# Patient Record
Sex: Female | Born: 1935
Health system: Southern US, Community
[De-identification: ages and names within clinical notes are randomized; demographics above are authoritative.]

---

## 2005-04-11 ENCOUNTER — Emergency Department: Payer: Self-pay | Admitting: Emergency Medicine

## 2005-04-11 ENCOUNTER — Other Ambulatory Visit: Payer: Self-pay

## 2005-04-19 ENCOUNTER — Emergency Department: Payer: Self-pay | Admitting: Internal Medicine

## 2005-04-19 ENCOUNTER — Other Ambulatory Visit: Payer: Self-pay

## 2005-04-26 ENCOUNTER — Ambulatory Visit: Payer: Self-pay | Admitting: Internal Medicine

## 2005-05-17 ENCOUNTER — Ambulatory Visit: Payer: Self-pay | Admitting: Unknown Physician Specialty

## 2005-07-11 ENCOUNTER — Ambulatory Visit: Payer: Self-pay | Admitting: Unknown Physician Specialty

## 2005-11-08 ENCOUNTER — Ambulatory Visit: Payer: Self-pay | Admitting: Unknown Physician Specialty

## 2006-08-06 ENCOUNTER — Ambulatory Visit: Payer: Self-pay | Admitting: Ophthalmology

## 2006-08-12 ENCOUNTER — Ambulatory Visit: Payer: Self-pay | Admitting: Ophthalmology

## 2007-06-23 ENCOUNTER — Ambulatory Visit: Payer: Self-pay | Admitting: Radiation Oncology

## 2007-07-03 ENCOUNTER — Ambulatory Visit: Payer: Self-pay | Admitting: Radiation Oncology

## 2007-07-23 ENCOUNTER — Ambulatory Visit: Payer: Self-pay | Admitting: Radiation Oncology

## 2007-08-23 ENCOUNTER — Ambulatory Visit: Payer: Self-pay | Admitting: Radiation Oncology

## 2007-09-22 ENCOUNTER — Ambulatory Visit: Payer: Self-pay | Admitting: Radiation Oncology

## 2007-10-23 ENCOUNTER — Ambulatory Visit: Payer: Self-pay | Admitting: Radiation Oncology

## 2008-01-21 ENCOUNTER — Ambulatory Visit: Payer: Self-pay | Admitting: Radiation Oncology

## 2008-02-04 ENCOUNTER — Ambulatory Visit: Payer: Self-pay | Admitting: Internal Medicine

## 2008-02-05 ENCOUNTER — Ambulatory Visit: Payer: Self-pay | Admitting: Physician Assistant

## 2008-02-12 ENCOUNTER — Ambulatory Visit: Payer: Self-pay | Admitting: Physician Assistant

## 2008-02-12 ENCOUNTER — Ambulatory Visit: Payer: Self-pay | Admitting: Radiation Oncology

## 2008-02-20 ENCOUNTER — Ambulatory Visit: Payer: Self-pay | Admitting: Radiation Oncology

## 2008-03-10 ENCOUNTER — Other Ambulatory Visit: Payer: Self-pay

## 2008-03-10 ENCOUNTER — Observation Stay: Payer: Self-pay | Admitting: Unknown Physician Specialty

## 2008-04-19 ENCOUNTER — Ambulatory Visit: Payer: Self-pay | Admitting: Urology

## 2008-04-21 ENCOUNTER — Ambulatory Visit: Payer: Self-pay | Admitting: Radiation Oncology

## 2009-01-20 ENCOUNTER — Emergency Department: Payer: Self-pay | Admitting: Emergency Medicine

## 2010-02-27 ENCOUNTER — Ambulatory Visit: Payer: Self-pay | Admitting: Internal Medicine

## 2011-10-19 ENCOUNTER — Ambulatory Visit: Payer: Self-pay | Admitting: Physician Assistant

## 2012-03-15 ENCOUNTER — Inpatient Hospital Stay: Payer: Self-pay | Admitting: Internal Medicine

## 2012-03-15 LAB — BASIC METABOLIC PANEL
Anion Gap: 6 — ABNORMAL LOW (ref 7–16)
BUN: 12 mg/dL (ref 7–18)
Calcium, Total: 8.4 mg/dL — ABNORMAL LOW (ref 8.5–10.1)
Chloride: 102 mmol/L (ref 98–107)
Co2: 30 mmol/L (ref 21–32)
Creatinine: 0.58 mg/dL — ABNORMAL LOW (ref 0.60–1.30)
Osmolality: 274 (ref 275–301)
Sodium: 138 mmol/L (ref 136–145)

## 2012-03-15 LAB — URINALYSIS, COMPLETE
Bacteria: NONE SEEN
Glucose,UR: NEGATIVE mg/dL (ref 0–75)
Nitrite: NEGATIVE
RBC,UR: 2 /HPF (ref 0–5)
Specific Gravity: 1.015 (ref 1.003–1.030)
Squamous Epithelial: 1
WBC UR: 1 /HPF (ref 0–5)

## 2012-03-15 LAB — CBC WITH DIFFERENTIAL/PLATELET
Basophil %: 0.3 %
Eosinophil #: 0 10*3/uL (ref 0.0–0.7)
Eosinophil %: 0.6 %
Lymphocyte #: 1.2 10*3/uL (ref 1.0–3.6)
Lymphocyte %: 16.5 %
MCH: 30.7 pg (ref 26.0–34.0)
MCV: 90 fL (ref 80–100)
Monocyte #: 0.5 x10 3/mm (ref 0.2–0.9)
Neutrophil #: 5.3 10*3/uL (ref 1.4–6.5)
Neutrophil %: 76.1 %
RDW: 13.6 % (ref 11.5–14.5)
WBC: 7 10*3/uL (ref 3.6–11.0)

## 2012-03-16 ENCOUNTER — Ambulatory Visit: Payer: Self-pay | Admitting: Orthopaedic Surgery

## 2012-03-16 LAB — BASIC METABOLIC PANEL
Anion Gap: 9 (ref 7–16)
BUN: 9 mg/dL (ref 7–18)
Chloride: 106 mmol/L (ref 98–107)
Creatinine: 0.43 mg/dL — ABNORMAL LOW (ref 0.60–1.30)
EGFR (African American): >60
Glucose: 73 mg/dL (ref 65–99)
Osmolality: 277 (ref 275–301)
Sodium: 140 mmol/L (ref 136–145)

## 2012-03-16 LAB — CBC WITH DIFFERENTIAL/PLATELET
Basophil %: 0.7 %
Eosinophil #: 0 10*3/uL (ref 0.0–0.7)
Lymphocyte #: 0.7 10*3/uL — ABNORMAL LOW (ref 1.0–3.6)
Lymphocyte %: 18.1 %
MCH: 30.8 pg (ref 26.0–34.0)
MCHC: 34.6 g/dL (ref 32.0–36.0)
Monocyte #: 0.4 x10 3/mm (ref 0.2–0.9)
Monocyte %: 9.6 %
Neutrophil #: 2.8 10*3/uL (ref 1.4–6.5)
Neutrophil %: 70.9 %
Platelet: 137 10*3/uL — ABNORMAL LOW (ref 150–440)
RBC: 3.56 10*6/uL — ABNORMAL LOW (ref 3.80–5.20)
WBC: 3.9 10*3/uL (ref 3.6–11.0)

## 2012-04-16 ENCOUNTER — Observation Stay: Payer: Self-pay | Admitting: Internal Medicine

## 2012-04-16 LAB — COMPREHENSIVE METABOLIC PANEL
Albumin: 2.8 g/dL — ABNORMAL LOW (ref 3.4–5.0)
Alkaline Phosphatase: 83 U/L (ref 50–136)
BUN: 10 mg/dL (ref 7–18)
Bilirubin,Total: 1.1 mg/dL — ABNORMAL HIGH (ref 0.2–1.0)
Calcium, Total: 8.4 mg/dL — ABNORMAL LOW (ref 8.5–10.1)
Chloride: 101 mmol/L (ref 98–107)
Co2: 28 mmol/L (ref 21–32)
Creatinine: 0.7 mg/dL (ref 0.60–1.30)
EGFR (African American): >60
EGFR (Non-African Amer.): >60
Glucose: 73 mg/dL (ref 65–99)
Potassium: 4 mmol/L (ref 3.5–5.1)
SGOT(AST): 16 U/L (ref 15–37)
Sodium: 136 mmol/L (ref 136–145)
Total Protein: 6.3 g/dL — ABNORMAL LOW (ref 6.4–8.2)

## 2012-04-16 LAB — CBC
HCT: 34.9 % — ABNORMAL LOW (ref 35.0–47.0)
MCHC: 33.9 g/dL (ref 32.0–36.0)
MCV: 91 fL (ref 80–100)
Platelet: 157 10*3/uL (ref 150–440)
WBC: 4.9 10*3/uL (ref 3.6–11.0)

## 2012-04-16 LAB — URINALYSIS, COMPLETE
Glucose,UR: NEGATIVE mg/dL (ref 0–75)
Nitrite: POSITIVE
Ph: 6 (ref 4.5–8.0)
Protein: 30
RBC,UR: 43 /HPF (ref 0–5)
WBC UR: 2168 /HPF (ref 0–5)

## 2012-04-16 LAB — CK TOTAL AND CKMB (NOT AT ARMC)
CK, Total: 47 U/L (ref 21–215)
CK-MB: 1 ng/mL (ref 0.5–3.6)

## 2012-04-16 LAB — PROTIME-INR: Prothrombin Time: 13.8 secs (ref 11.5–14.7)

## 2012-04-17 LAB — CBC WITH DIFFERENTIAL/PLATELET
Basophil %: 0.7 %
HCT: 33.6 % — ABNORMAL LOW (ref 35.0–47.0)
Lymphocyte #: 0.4 10*3/uL — ABNORMAL LOW (ref 1.0–3.6)
Lymphocyte %: 14.7 %
MCHC: 34.2 g/dL (ref 32.0–36.0)
MCV: 90 fL (ref 80–100)
Monocyte %: 7.6 %
Neutrophil %: 75.6 %
Platelet: 157 10*3/uL (ref 150–440)
RBC: 3.71 10*6/uL — ABNORMAL LOW (ref 3.80–5.20)
RDW: 14.2 % (ref 11.5–14.5)
WBC: 4.9 10*3/uL (ref 3.6–11.0)

## 2012-04-17 LAB — BASIC METABOLIC PANEL
Anion Gap: 8 (ref 7–16)
BUN: 13 mg/dL (ref 7–18)
Chloride: 104 mmol/L (ref 98–107)
Co2: 27 mmol/L (ref 21–32)
EGFR (African American): >60
EGFR (Non-African Amer.): >60
Glucose: 84 mg/dL (ref 65–99)
Osmolality: 277 (ref 275–301)

## 2012-04-18 LAB — URINE CULTURE

## 2012-04-21 ENCOUNTER — Emergency Department: Payer: Self-pay | Admitting: Emergency Medicine

## 2012-04-21 LAB — COMPREHENSIVE METABOLIC PANEL
Albumin: 2.6 g/dL — ABNORMAL LOW (ref 3.4–5.0)
Alkaline Phosphatase: 80 U/L (ref 50–136)
BUN: 8 mg/dL (ref 7–18)
Bilirubin,Total: 0.7 mg/dL (ref 0.2–1.0)
Chloride: 103 mmol/L (ref 98–107)
Co2: 30 mmol/L (ref 21–32)
Creatinine: 0.59 mg/dL — ABNORMAL LOW (ref 0.60–1.30)
EGFR (African American): >60
Glucose: 90 mg/dL (ref 65–99)
SGOT(AST): 17 U/L (ref 15–37)
SGPT (ALT): 10 U/L — ABNORMAL LOW
Sodium: 138 mmol/L (ref 136–145)
Total Protein: 5.8 g/dL — ABNORMAL LOW (ref 6.4–8.2)

## 2012-04-21 LAB — CBC WITH DIFFERENTIAL/PLATELET
Basophil %: 0.8 %
HGB: 11.5 g/dL — ABNORMAL LOW (ref 12.0–16.0)
Lymphocyte %: 25.6 %
MCH: 30.4 pg (ref 26.0–34.0)
MCHC: 33.3 g/dL (ref 32.0–36.0)
MCV: 91 fL (ref 80–100)
Monocyte #: 0.3 x10 3/mm (ref 0.2–0.9)
Neutrophil %: 61.7 %
Platelet: 202 10*3/uL (ref 150–440)
WBC: 3.5 10*3/uL — ABNORMAL LOW (ref 3.6–11.0)

## 2012-04-21 LAB — URINALYSIS, COMPLETE
Bilirubin,UR: NEGATIVE
Blood: NEGATIVE
Ketone: NEGATIVE
Ph: 7 (ref 4.5–8.0)
Protein: NEGATIVE
RBC,UR: 1 /HPF (ref 0–5)
Specific Gravity: 1.009 (ref 1.003–1.030)
Squamous Epithelial: <1

## 2012-05-22 DEATH — deceased

## 2012-07-24 IMAGING — CT CT OF THE RIGHT HIP WITHOUT CONTRAST
1 series · 16 of 32 positions shown, 20 images · non-contrast
Comparison: none

REASON FOR EXAM: right hip pain after a fall
COMMENTS:

PROCEDURE:     CT  - CT HIP RIGHT WITHOUT CONTRAST  - March 15, 2012  [DATE]
RESULT:
TECHNIQUE: Multiplanar imaging of the right hip was obtained utilizing
helical 2 and 3 millimeter acquisition and bone reconstruction algorithm.

[Series 2: hip 3.0 b70s · axial · 0.37mm/px · z∈[-862,-656]mm · 16 of 77 slices shown, 20 images]
[im 5/77  soft-tissue]
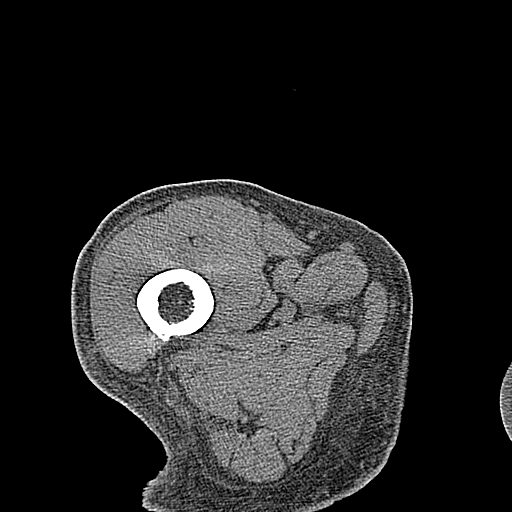
[im 5/77  bone]
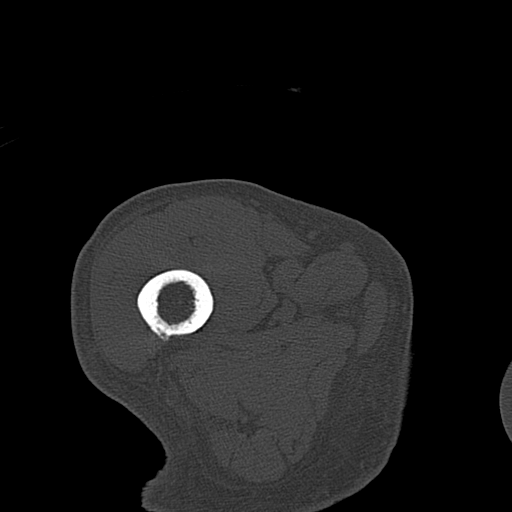
[im 10/77  soft-tissue]
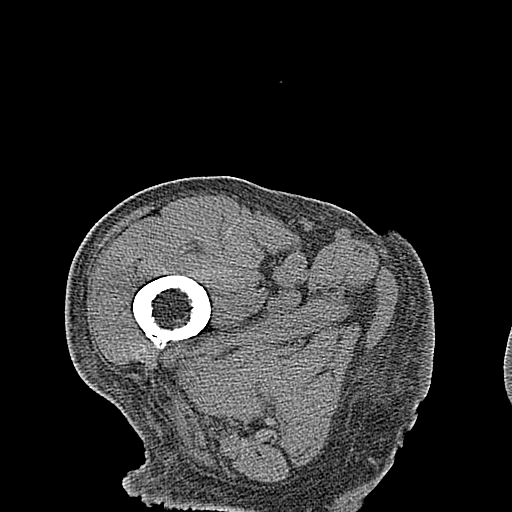
[im 15/77  soft-tissue]
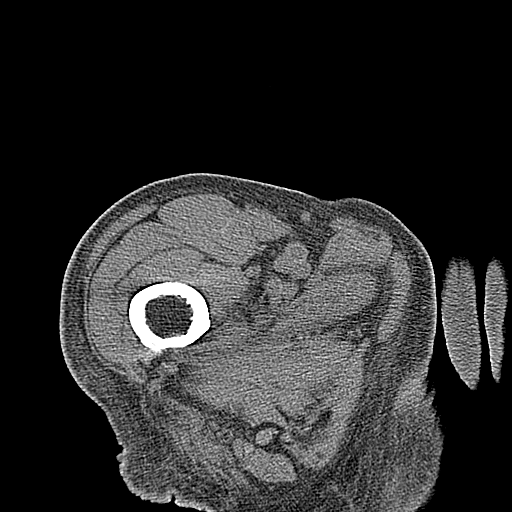
[im 20/77  soft-tissue]
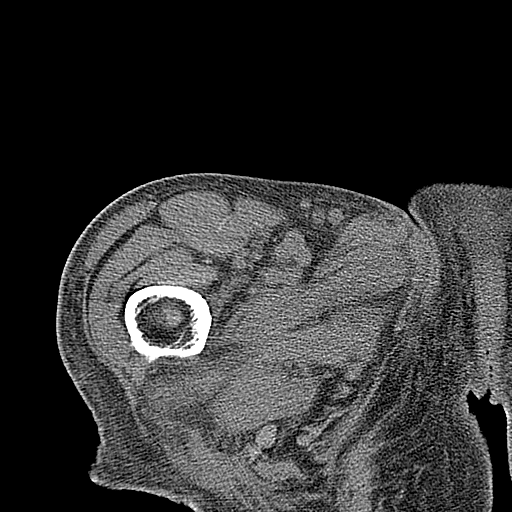
[im 25/77  soft-tissue]
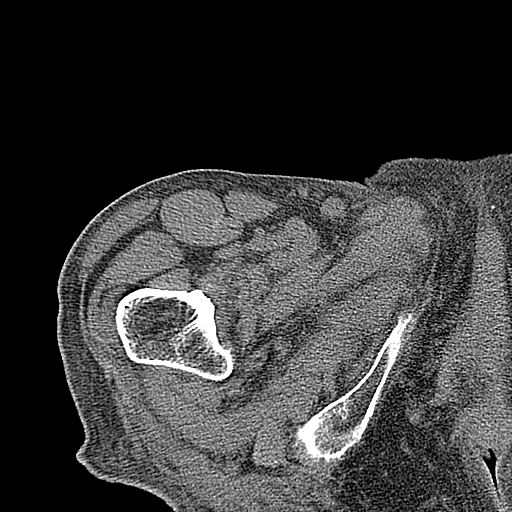
[im 30/77  soft-tissue]
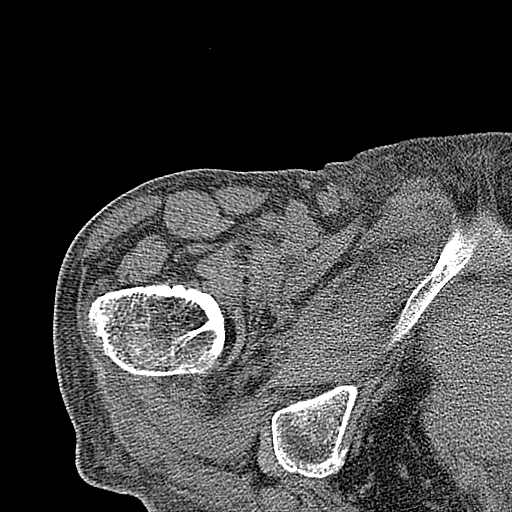
[im 35/77  soft-tissue]
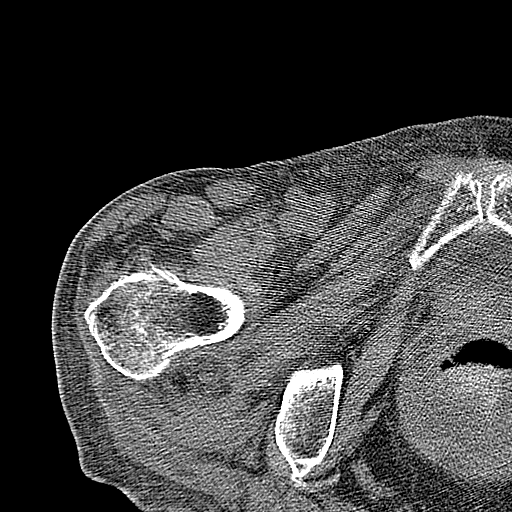
[im 42/77  soft-tissue]
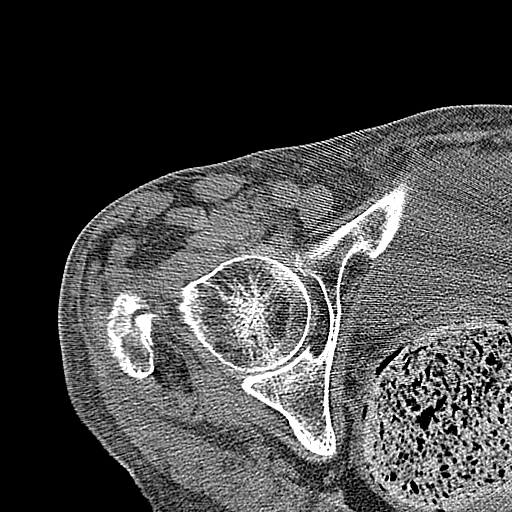
[im 47/77  soft-tissue]
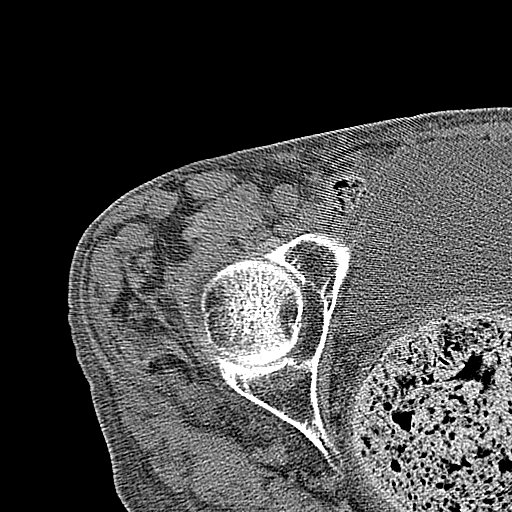
[im 47/77  bone]
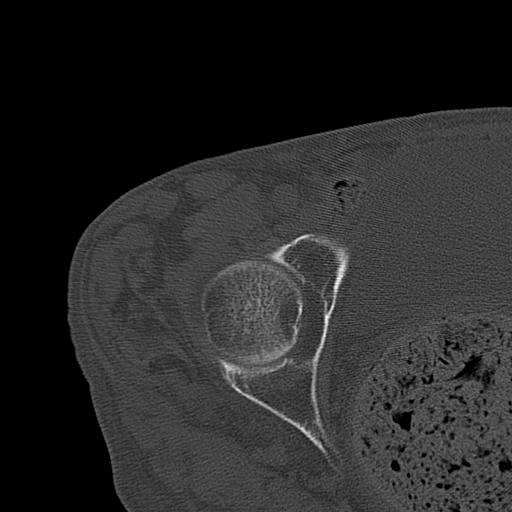
[im 52/77  soft-tissue]
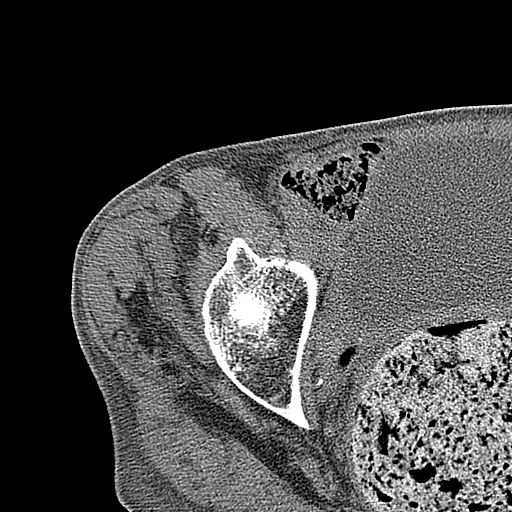
[im 57/77  soft-tissue]
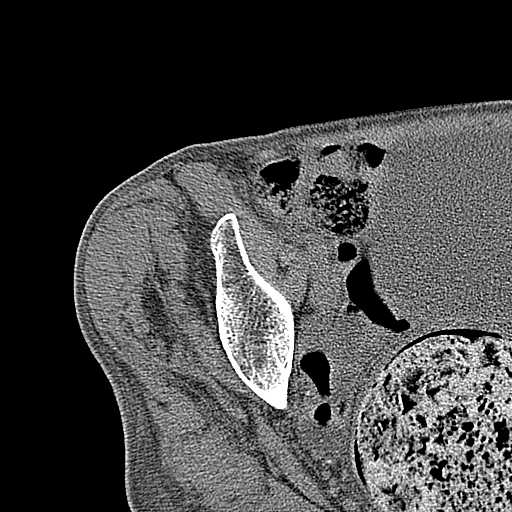
[im 62/77  soft-tissue]
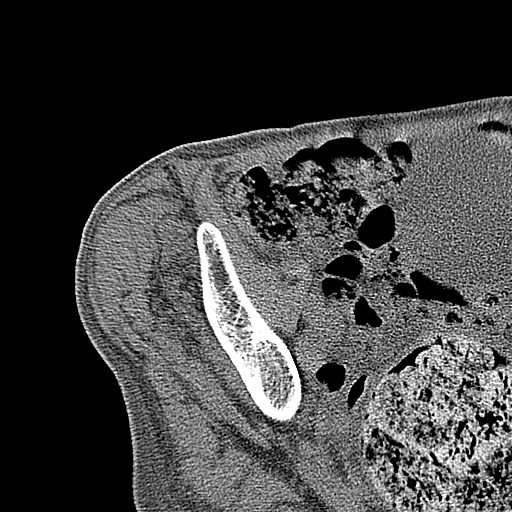
[im 67/77  soft-tissue]
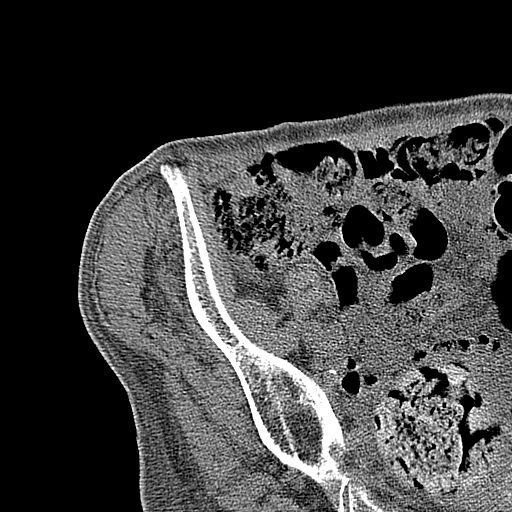
[im 67/77  lung]
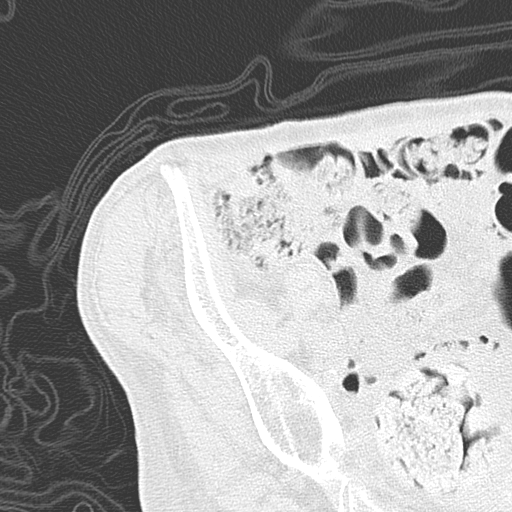
[im 69/77  lung]
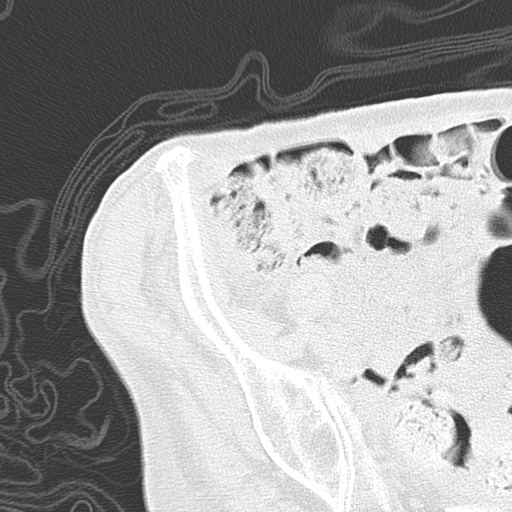
[im 72/77  soft-tissue]
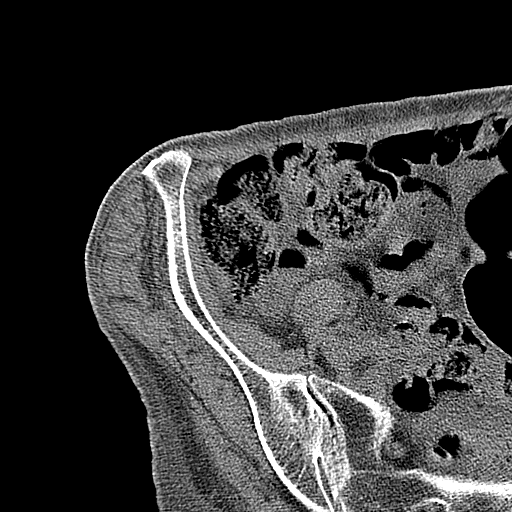
[im 72/77  lung]
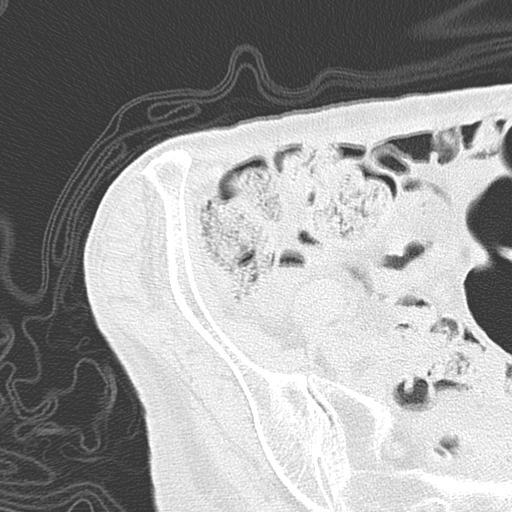
[im 74/77  lung]
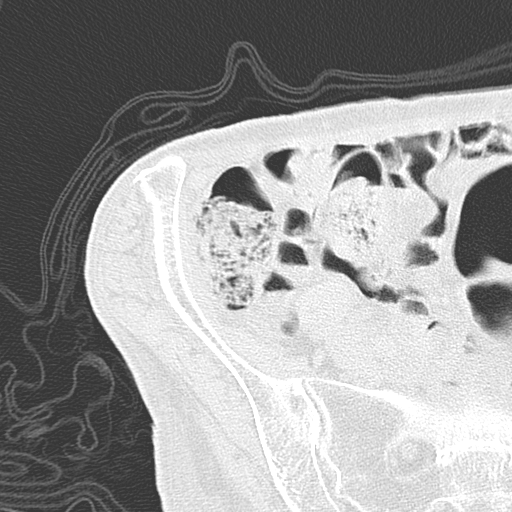

[16 of 32 positions shown; findings below may reference images not displayed]

FINDINGS: A nondisplaced fracture is appreciated involving the medial border
of the greater trochanter. The fracture appears to be isolated to the
greater trochanter. The bones are osteopenic. A large amount stool is
appreciated in the region of the rectosigmoid colon.
IMPRESSION: 1. Nondisplaced fracture involving the greater trochanter.

## 2012-08-25 IMAGING — CR PELVIS - 1-2 VIEW
1 series · 1 of 1 positions shown · non-contrast
Comparison: none

REASON FOR EXAM: PELVIC PAIN
COMMENTS:

[ap]
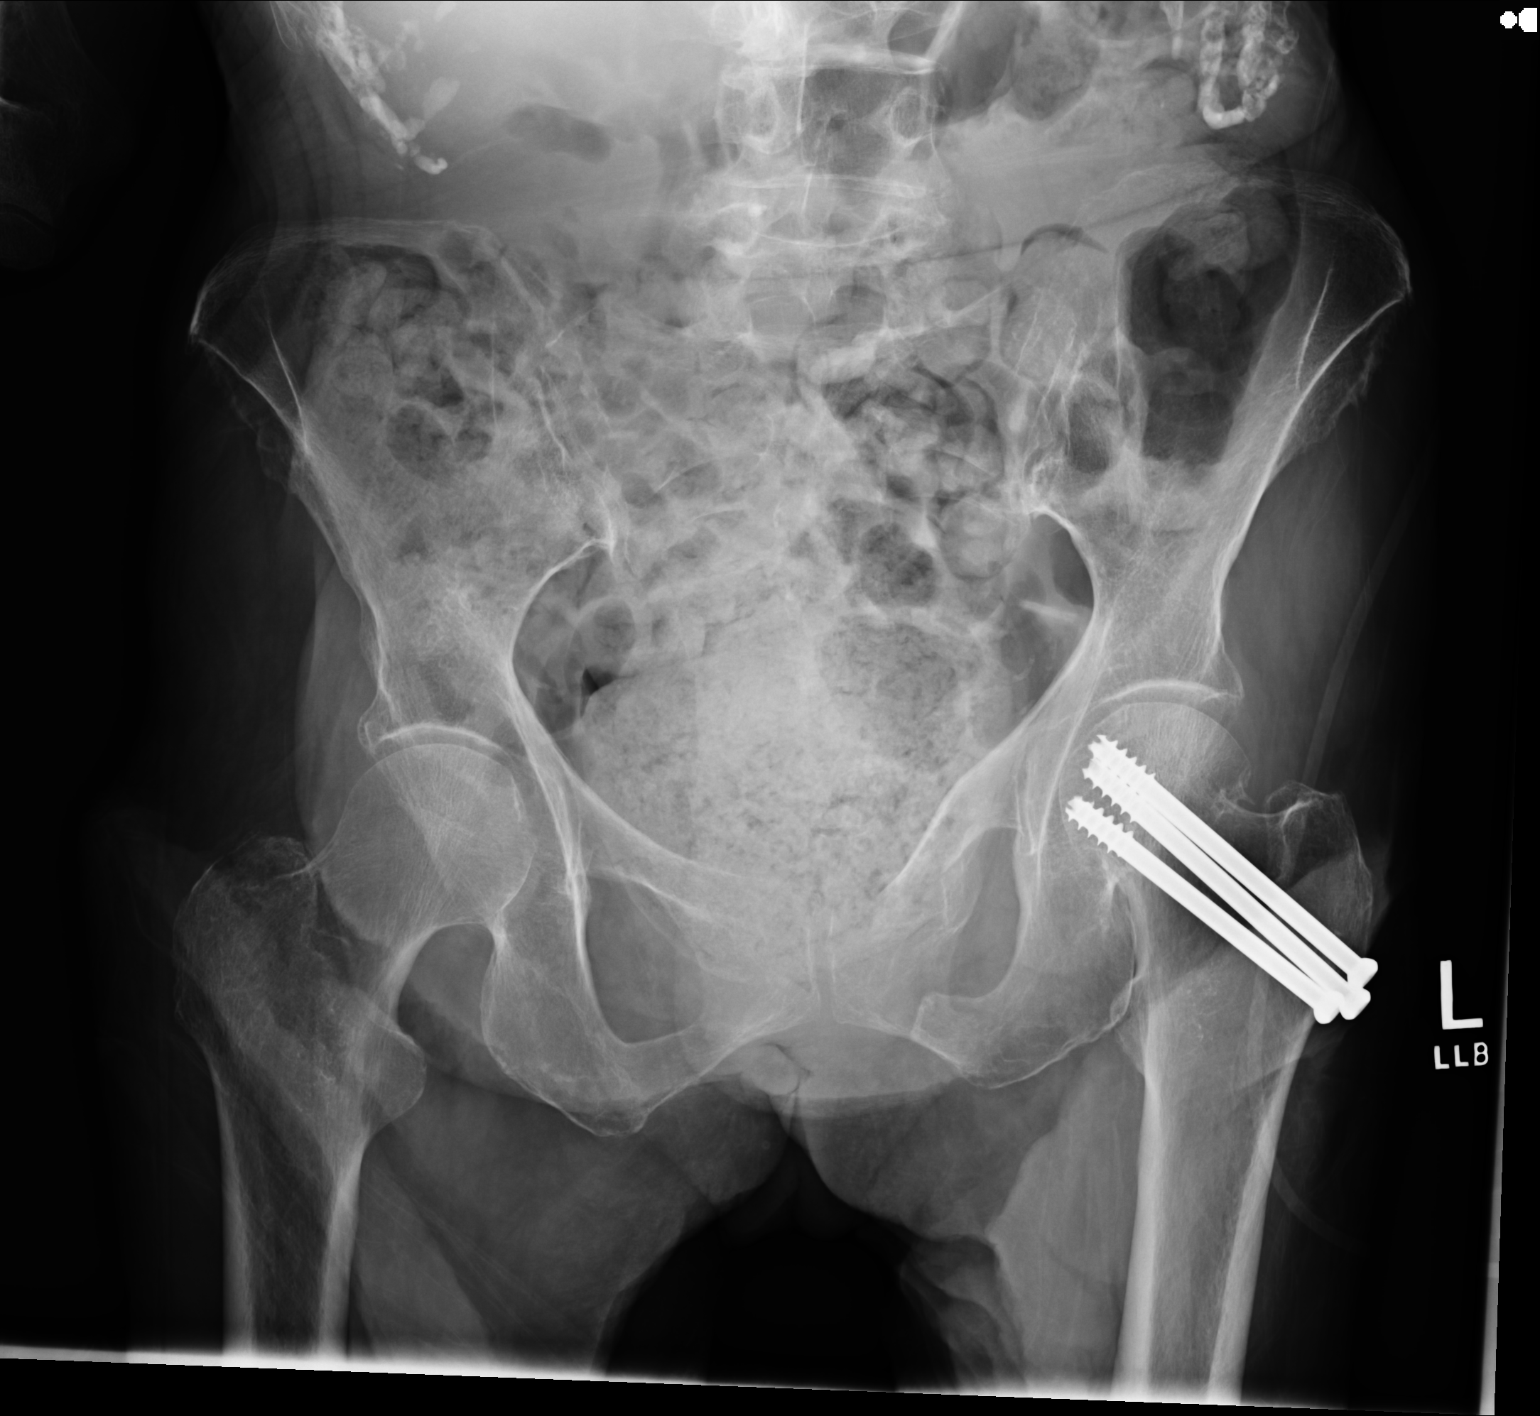

[1 of 1 positions shown; findings below may reference images not displayed]

PROCEDURE:     DXR - DXR PELVIS AP ONLY  - April 16, 2012 [DATE]

RESULT:     Comparison is made to the study 15 March, 2012.

Multiple pins are seen in the proximal left femur in the hip region. There
is again noted large amount of fecal material in the rectum consistent with
fecal impaction. This was present on the previous study as well. There is
deformity of the left pubic region consistent with pubic bone fracture which
is poorly demonstrated because of the underlying fecal material. This
appearance was not definitely evident on the previous exam. Has there been
interval prominent? The other portions of the pelvis appear intact but again
are poorly seen.

Within the right hip there is evidence of an intratrochanteric fracture. No
significant distraction or is significant comminution is evident.
IMPRESSION: 1. Fecal impaction. This was present previously and does not appear to be
significantly altered.
2. Deformity of the left pubic bone superior and inferior pubic rami
consistent with fracture which were not demonstrated previously.
3. Intratrochanteric proximal right femoral fracture.

[REDACTED]

## 2015-02-13 NOTE — Consult Note (Signed)
Brief Consult Note: Diagnosis: pelvis fracture, g. troch fx.   Patient was seen by consultant.   Consult note dictated.   Comments: pain is only limit on PT, g trochanter fracture stable after a month and should be mostly healed.  Electronic Signatures: Leitha SchullerMenz, Anastashia Westerfeld J (MD)  (Signed 27-Jun-13 07:17)  Authored: Brief Consult Note   Last Updated: 27-Jun-13 07:17 by Leitha SchullerMenz, Delma Villalva J (MD)

## 2015-02-13 NOTE — Discharge Summary (Signed)
PATIENT NAME:  Dominique CaffeyDAVIS, Zehra M MR#:  [id number] DATE OF BIRTH:  1929/03/30  DATE OF ADMISSION:  04/16/2012 DATE OF DISCHARGE:  04/18/2012   DISCHARGE DIAGNOSES:  1. Pelvic fracture.  2. Recent trochanteric fracture, nondisplaced.  3. Failure to thrive.  4. Urinary tract infection.  5. Cachexia.  6. Hypertension.   DISCHARGE MEDICATIONS:  1. Tylenol p.r.n.  2. Norco p.r.n. per orders.  3. Coreg 3.125 b.i.d.  4. Cipro 500 b.i.d. for urinary tract infection, E. coli, though sensitivities are not back yet and need to be followed up. 5. Diltiazem 120 daily.  6. Docusate 100 mg b.i.d.  7. Lovenox 30 mg daily.  8. Lactulose 30 mL b.i.d.  9. Ativan 0.5 q.8 p.r.n. with prescriptions written.  10. Protonix 40 mg daily.  11. MiraLAX 17 grams daily.  12. Senokot 1 b.i.d.  13. Vitamin D 2000 units daily.  14. Remeron 7.5 mg at bedtime.   HOSPITAL COURSE: The patient was admitted with above. She was weigh bear as tolerated per Orthopedic consult. Reinstitution of Vitamin D was done. She could not eat well so Remeron will be restarted. This has not been done as outpatient as of yet. Labs were basically unremarkable. CT noted pelvic fracture. Escherichia coli was noted. Otherwise, things were as above unremarkable. She will need further PT/OT and fall prevention.    FOLLOW-UP: Follow-up with Orthopedics, etc.   ____________________________ Marya AmslerMarshall W. Dareen PianoAnderson, MD mwa:drc D: 04/18/2012 08:00:53 ET T: 04/18/2012 08:23:59 ET JOB#: [id number]  cc: Marya AmslerMarshall W. Dareen PianoAnderson, MD, <Dictator> Lauro RegulusMARSHALL W Amiee Wiley MD ELECTRONICALLY SIGNED 04/18/2012 11:45

## 2015-02-13 NOTE — Consult Note (Signed)
PATIENT NAME:  Dominique Turner, Dominique Turner MR#:  [id number] DATE OF BIRTH:  09/28/1929  DATE OF CONSULTATION:  04/17/2012  CONSULTING PHYSICIAN:  Leitha SchullerMichael J. Dlynn Ranes, MD  REASON FOR CONSULTATION: Pelvic fracture.   HISTORY OF PRESENT ILLNESS: The patient is an 81 year old female who recently suffered a right greater trochanter fracture treated nonoperatively and was getting over that when she suffered a fall at home on Monday, the 24th of June. She was standing and just fell down. She had difficulty walking at home. Her husband has been trying to take care of her at home. She came into the Emergency Room and had x-ray and CT that shows her prior greater trochanter fracture with no further displacement then previous. She has a new pelvic fracture of the left superior and inferior rami.   PHYSICAL EXAMINATION: On exam, she is nontender to log rolling. She does have slight tenderness over the greater trochanter and is tender over the anterior left side of her pelvis. She does not have pain with pelvic compression or posterior pressure at the iliac crest. She is able to flex and extend the toes and does not appear to have any neurovascular deficit.   CLINICAL IMPRESSION: Right greater trochanter fracture which should not require any further treatment and should not limit her therapy since it is over a month old. With regard to the pelvic fracture, this is stable. It does not require surgery and should be relatively pain-free within a month. It will slow her down with therapy, but she will be weight-bearing as tolerated. Physical therapy orders are entered.   ___________________________ Leitha SchullerMichael J. Rinda Rollyson, MD mjm:cbb D: 04/17/2012 07:14:53 ET T: 04/17/2012 09:54:39 ET JOB#: [id number]  cc: Leitha SchullerMichael J. Shaira Sova, MD, <Dictator> Leitha SchullerMICHAEL J Verlon Pischke MD ELECTRONICALLY SIGNED 04/17/2012 17:17

## 2015-02-13 NOTE — H&P (Signed)
PATIENT NAME:  Dominique CaffeyDAVIS, Makeba M MR#:  [id number] DATE OF BIRTH:  November 07, 1928  DATE OF ADMISSION:  04/16/2012  ER REFERRING PHYSICIAN: Glennie IsleSheryl Gottlieb, MD  PRIMARY CARE PHYSICIAN: Einar CrowMarshall Anderson, MD   CHIEF COMPLAINT: Status post fall with pelvic fracture, urinary tract infection.   HISTORY OF PRESENT ILLNESS: The patient is an 81 year old white female with a history of Alzheimer's dementia, history of atrial fibrillation, who is followed by Dr. Dareen PianoAnderson as an outpatient, who was hospitalized 03/15/2012 after a fall. At that time, she had a right trochanteric fracture. On Monday, the patient was walking with the help of her husband when he went to shut the blinds, and all of a sudden she collapsed. Her husband reports that the way she walks is she holds onto him and is able to walk that way. The patient, after that, started having more pain in her hip and had difficulty walking; therefore, she was brought to the ED. In the ER, initially there was concern that she may have an intertrochanteric right femoral fracture; however, CT scan of the pelvis showed slightly displaced fracture of the greater trochanter which she had previously. She was seen by the orthopedist, Dr. Rosita KeaMenz, who felt that this was inoperable. The patient is noted to have a new pelvic fracture of the left pubic bone as well as superior and inferior pubic rami. Due to the case being inoperable, I am asked to admit the patient. She also has been noted to have a urinary tract infection with significant pyuria in her urine. She is also noted to be severely constipated on x-ray. The patient is a very poor historian. Her husband answers most of the questions for her. He reports that she has had lack of appetite and was started on appetite stimulant by her primary care physician recently. She is a very picky eater, only eats certain things. She has not had any fevers, chills. No chest pains, no shortness of breath. No abdominal pain. Her bowel  movements have been very irregular, according to him. She has not complained of urinary frequency, urgency, or hesitancy.   PAST MEDICAL/SURGICAL HISTORY:  1. History of chronic atrial fibrillation.  2. Hypertension.  3. Alzheimer's dementia.  4. History of breast cancer, status post right-sided mastectomy and left-sided lumpectomy.   ALLERGIES: Sulfa and Fosamax.  OUTPATIENT MEDICATIONS:  1. Cardizem CD 120 mg daily.  2. Carvedilol 3.125, 1 tab p.o. b.i.d.  3. Protonix 40 mg daily.  4. Lorazepam 0.5 q.i.d. It is not clear whether this is p.r.n. or not.  5. She is also on some sort of appetite stimulant as well, according to the husband's report.   SOCIAL HISTORY: She is married and lives with her husband. No alcohol or tobacco abuse.   FAMILY HISTORY: Positive for hypertension, coronary artery disease.   REVIEW OF SYSTEMS: Review of systems is limited due to the patient's dementia.   PHYSICAL EXAMINATION:  VITAL SIGNS: Temperature 97.9, pulse 78, respirations 18, blood pressure 125/60, and O2 95% on room air.   GENERAL: The patient is a very debilitated elderly-appearing female currently not in any acute distress.   HEENT: Head atraumatic, normocephalic. Pupils are equally round, reactive to light and accommodation. There is no conjunctival pallor. No scleral icterus. Nasal exam shows no ulceration or drainage. External ear exam shows no erythema, no drainage.   NECK: No thyromegaly. No carotid bruits.   CARDIOVASCULAR: Regular rate and rhythm. No murmurs, rubs, clicks, or gallops. PMI is nondisplaced.   LUNGS:  Clear to auscultation bilaterally without any rales, rhonchi, or wheezing.   ABDOMEN: Soft, nontender, nondistended. Positive bowel sounds x4. There is no guarding or rebound. No hepatosplenomegaly.   EXTREMITIES: Without any cyanosis, clubbing or edema. Pulses were 2+ bilaterally.   SKIN: Warm and dry without rash or lesions.   NEUROLOGICAL: Cranial nerves II  through XII are grossly intact. No focal deficits.  PSYCHIATRIC: The patient is alert but not oriented to place, oriented to person but not to time, is not anxious or depressed appearing.   LABORATORY, DIAGNOSTIC AND RADIOLOGICAL DATA:  In the ED, CT scan of the right hip shows slightly displaced fracture of the greater trochanter. Urinalysis shows 1+ blood, nitrites positive, leukocytes 3+.  WBCs 2168.  Chest x-ray shows stable cardiomegaly, prominent scoliosis. CK-MB 1.0.  BMP: Glucose 73, BUN 10, creatinine 0.70, sodium 136, potassium 4.0, chloride 101, CO2 28. LFTs showed a bilirubin total of 1.1, ALT 12, AST 16.  WBC 4.9, hemoglobin 11.8, platelet count 157.  Pelvic x-ray shows fecal impaction, deformity of the left pubic bone, superior and inferior pubic rami consistent with fracture.  WBC 3.9, hemoglobin 11, platelet count is 137.  BMP shows a glucose of 73, BUN 9, creatinine 0.43, sodium 140, potassium slightly low at 3.3.   ASSESSMENT AND PLAN: The patient is an 81 year old status post fall, has difficulty with walking, ambulating, now has a new pelvic fracture, has persistent trochanteric fracture. She was seen by Dr. Rosita Kea, who reports that this is nonoperable.   1. Pelvic fracture with pain: At this time, we will control her pain. Weight-bearing status as per Orthopedics.   2. Urinary tract infection: We will place her on IV Rocephin.  3. Severe constipation: I will start her on Colace, Senna, MiraLax and lactulose. May need Fleet's Enema. Stop some of these stool softeners and stimulants once she has adequate bowel movement.  4. Alzheimer's dementia: Currently not on any treatment. We will continue lorazepam as needed for anxiety.  5. History of atrial fibrillation: Continue Cardizem and Coreg as taking at home. We will place her on aspirin as well.  6. Prophylaxis: We will place her on Lovenox for deep vein thrombosis prophylaxis.  7. Disposition: We will also consult case  manager for disposition planning.        TIME SPENT:   35 minutes spent.  ____________________________ Lacie Scotts. Allena Katz, MD shp:cbb D: 04/16/2012 17:16:30 ET T: 04/16/2012 18:18:48 ET JOB#: [id number]  cc: Zamyra Allensworth H. Allena Katz, MD, <Dictator> Marya Amsler. Dareen Piano, MD Charise Carwin MD ELECTRONICALLY SIGNED 04/17/2012 16:39

## 2015-02-13 NOTE — Discharge Summary (Signed)
PATIENT NAME:  Dominique CaffeyDAVIS, Jamil M MR#:  [id number] DATE OF BIRTH:  April 07, 1929  DATE OF ADMISSION:  03/15/2012 DATE OF DISCHARGE:  03/19/2012   DISCHARGE DIAGNOSES:  1. Hip fracture, nondisplaced, trochanteric. 2. Atrial fibrillation.  3. Severe dementia likely causing the fall given apraxia and frailty as a complication of this.  4. History of hypertension, controlled.  DISCHARGE MEDICATIONS:  1. Lorazepam 0.5 p.o. q.i.d. p.r.n. agitation.  2. Protonix 40 mg daily.  3. Coreg 3.125 mg p.o. b.i.d.  4. Cardizem CD 120 mg p.o. daily.  5. Aspirin 81 mg daily.  6. Norco 5/325 1 to 2 q.4 hours p.r.n. pain. 7. Vitamin D 2000 international units daily. 8. Senna S 2 p.o. b.i.d. 9. MiraLAX 17 grams daily p.r.n. constipation. 10. Milk of Magnesia 30 mL q.12 hours p.r.n. constipation. 11. Lovenox injection 40 mg sub-Q daily.  HISTORY AND PHYSICAL: Please see detailed history and physical done on admission.   HOSPITAL COURSE: The patient was admitted with fracture as noted. She was seen by Orthopedics, Dr. Luvenia HellerMalouf in the Emergency Room. Not felt to need admission by Orthopedics, not felt to be needing surgery, felt to be able to weight bear as tolerated. Given the fact she could not currently weight bear, she was admitted to the hospital under my service. She did well. Heart rate is controlled. No other complications. She did have some constipation which was dealt with. A little bit of confusion given the admission and the dementia. CT scan did confirm the nondisplaced fracture as noted.     It was felt that she needed further rehabilitation with PT/OT in a skilled facility. When one is found, she will be transferred there today with the above.   ____________________________ Marya AmslerMarshall W. Dareen PianoAnderson, MD mwa:drc D: 03/19/2012 07:26:45 ET T: 03/19/2012 08:21:55 ET JOB#: [id number]  cc: Marya AmslerMarshall W. Dareen PianoAnderson, MD, <Dictator> Lauro RegulusMARSHALL W Melane Windholz MD ELECTRONICALLY SIGNED 03/20/2012 6:27

## 2015-02-13 NOTE — Consult Note (Signed)
PATIENT NAME:  Dominique Turner, Dominique Turner MR#:  [id number] DATE OF BIRTH:  07/01/1929  DATE OF CONSULTATION:  03/16/2012  REFERRING PHYSICIAN:   CONSULTING PHYSICIAN:  Thomes CakePaul B. Jevonte Clanton, MD  CHIEF COMPLAINT: Right hip pain.   HISTORY OF PRESENT ILLNESS: This is an 81 year old female who presented to the Emergency Department 03/15/2012 in the evening complaining of right hip pain after a fall. She was admitted to the medical service with a diagnosis of a right greater trochanter fracture. She was admitted for pain control. The patient has a history of Alzheimer's dementia as well as multiple other medical problems.   PAST MEDICAL HISTORY: 1. Alzheimer's dementia.  2. Breast cancer. 3. High blood pressure.  PAST SURGICAL HISTORY: 1. Mastectomy. 2. ORIF left hip in the past.  MEDICATIONS: The patient takes multiple medications. She does not recall the names.   ALLERGIES: None.  SOCIAL HISTORY: The patient denies use of tobacco or alcohol.   PHYSICAL EXAMINATION: The bilateral upper extremities as well as the left lower extremity the patient has no tenderness to palpation, no crepitus, no deformities noted. She has full painless range of motion of all of her joints. With respect to the right lower extremity, the patient is tender over the right greater trochanter. She is able to do a straight leg raise. She has painless range of motion of her hip. There is no tenderness about the knee, femur, tibia, or ankle. She has motor intact distally with respect to her tibialis, anterior gastrocsoleus, EHL, and FHL. She has palpable pulses both DP and PT. Her sensation about her foot is intact.   X-rays as well as CT scan show evidence of right nondisplaced greater trochanteric fracture. The femoral neck is intact. There is no fractures noted about the hip joint.  ASSESSMENT: This is an [age] year old female with Alzheimer's dementia status post fall sustaining a right greater trochanteric fracture which is nondisplaced.    PLAN: 1. The patient does not require any surgical intervention for this injury. 2. The patient should be weightbearing as tolerated about the right lower extremity. I would recommend a course of physical therapy and admission to a rehab facility as the patient lives with her husband who is her only caregiver.  3. She should have adequate pain control to allow her to mobilize as tolerated.  4. I would recommend DVT prophylaxis since the patient is slow to mobilize. 5. The patient may follow-up with Orthopedics as an outpatient.  ____________________________ Thomes CakePaul B. Daneka Lantigua, MD pbm:drc D: 03/16/2012 19:55:22 ET T: 03/17/2012 08:55:48 ET JOB#: [id number]  cc: Renae FicklePaul B. Tazaria Dlugosz, MD, <Dictator> Gasper LloydPAUL B Amiyah Shryock MD ELECTRONICALLY SIGNED 04/16/2012 8:42

## 2015-02-13 NOTE — H&P (Signed)
PATIENT NAME:  Dominique Turner, Dominique Turner MR#:  [id number] DATE OF BIRTH:  [birth year]  DATE OF ADMISSION:  03/15/2012  REFERRING PHYSICIAN: Dr. Olivia MackieGina Martin in the Emergency Room   FAMILY PHYSICIAN: Einar CrowMarshall Anderson, MD   REASON FOR ADMISSION: Nondisplaced right hip fracture.   HISTORY OF PRESENT ILLNESS: The patient is an 81 year old female with a history of Alzheimer's dementia and atrial fibrillation followed by Dr. Dareen PianoAnderson who was outside today and stumbled and fell injuring her right hip. She was brought to the Emergency Room where she was found to have a nondisplaced right hip fracture. She had painful weightbearing and her husband was unable to care for her at home. She is now admitted for further evaluation. The patient denies chest pain or palpitations. No syncope or presyncope.   PAST MEDICAL HISTORY:  1. Chronic atrial fibrillation.  2. Benign hypertension.  3. Alzheimer's dementia.  4. Remote history of breast cancer.   MEDICATIONS:  1. Protonix 40 mg p.o. daily.  2. Ativan 0.5 mg p.o. q.i.d. p.r.n. agitation.  3. Coreg 3.125 mg p.o. b.i.d.  4. Cardizem CD 120 mg p.o. daily.  5. Aspirin 81 mg p.o. daily.   ALLERGIES: Sulfa and Fosamax.   SOCIAL HISTORY: The patient is married. No history of alcohol or tobacco abuse.   FAMILY HISTORY: Positive for hypertension and coronary artery disease.   REVIEW OF SYSTEMS: Unable to obtain from the patient due to her dementia.   PHYSICAL EXAMINATION:   GENERAL: The patient is chronically ill appearing in no acute distress.   VITAL SIGNS: Vital signs are remarkable for a blood pressure of 163/84 with a heart rate of 82, respiratory rate of 18. She is afebrile.   HEENT: Normocephalic, atraumatic. Pupils equally round and reactive to light and accommodation. Extraocular movements are intact. Sclerae are nonicteric. Conjunctivae are clear. Oropharynx is clear.   NECK: Supple without JVD or bruits. No adenopathy or thyromegaly is noted.    LUNGS: Clear to auscultation and percussion without wheezes, rales, or rhonchi. No dullness.   CARDIAC: Irregularly irregular rhythm. No significant rubs or gallops noted.   ABDOMEN: Soft, nontender with normoactive bowel sounds. No organomegaly or masses were appreciated. No hernias or bruits were noted.   EXTREMITIES: Without clubbing, cyanosis, or edema. Pulses were 2+ bilaterally.   SKIN: Warm and dry without rash or lesions.   NEUROLOGIC: Cranial nerves II through XII grossly intact. Deep tendon reflexes were symmetric. Motor and sensory exams nonfocal.   PSYCH: The patient was alert but disoriented to person, place, and time.   LABORATORY, DIAGNOSTIC, AND RADIOLOGICAL DATA: CBC was within normal limits. Chemistries revealed a glucose of 77 with a BUN of 12 and a creatinine of 0.58 with a sodium of 138 and a potassium of 4.2.   CT of the right hip revealed a nondisplaced fracture of the greater trochanter. The femoral head was intact.   ASSESSMENT:  1. Nondisplaced hip fracture.  2. Alzheimer's dementia.  3. Chronic atrial fibrillation. 4. Benign hypertension.   PLAN:  1. The patient will be admitted to Orthopedics with off-unit telemetry.  2. Will consult physical therapy as well as Orthopedics.  3. Will consult Care Manager for rehab placement.  4. Will continue her outpatient meds for now.  5. Further treatment and evaluation will depend upon the patient's progress.   TOTAL TIME SPENT ON THIS PATIENT: 45 minutes.   ____________________________ Duane LopeJeffrey D. Judithann SheenSparks, MD jds:drc D: 03/15/2012 21:43:14 ET T: 03/16/2012 08:43:18 ET JOB#: [id number]  cc:  Duane Lope. Judithann Sheen, MD, <Dictator> Marya Amsler. Dareen Piano, MD Jontae Sonier Rodena Medin MD ELECTRONICALLY SIGNED 03/16/2012 12:49
